# Patient Record
Sex: Male | Born: 2000 | Race: White | Hispanic: No | Marital: Single | Smoking: Current every day smoker
Health system: Southern US, Community
[De-identification: ages and names within clinical notes are randomized; demographics above are authoritative.]

## PROBLEM LIST (undated history)

## (undated) DIAGNOSIS — I10 Essential (primary) hypertension: Secondary | ICD-10-CM

## (undated) HISTORY — PX: TONSILLECTOMY: SUR1361

## (undated) HISTORY — PX: HIP SURGERY: SHX245

## (undated) HISTORY — PX: INNER EAR SURGERY: SHX679

---
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
## 2021-07-08 IMAGING — CT CT MAXILLOFACIAL WITHOUT CONTRAST
4 of 6 series · 16 of 47 positions shown, 18 images · non-contrast
Comparison: None.

CLINICAL DATA: Facial injury after hit by tree limb. Positive loss
of consciousness.

EXAM:
CT HEAD WITHOUT CONTRAST
CT MAXILLOFACIAL WITHOUT CONTRAST
TECHNIQUE: Multidetector CT imaging of the head and maxillofacial structures
were performed using the standard protocol without intravenous
contrast. Multiplanar CT image reconstructions of the maxillofacial
structures were also generated.

[Series 3: head wo · axial · 0.47mm/px · z∈[+1521,+1631]mm · 6 of 32 slices shown, 8 images]
[im 5/32  brain]
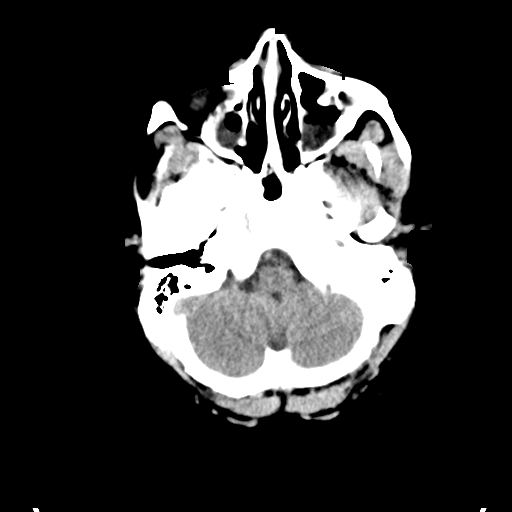
[im 5/32  bone]
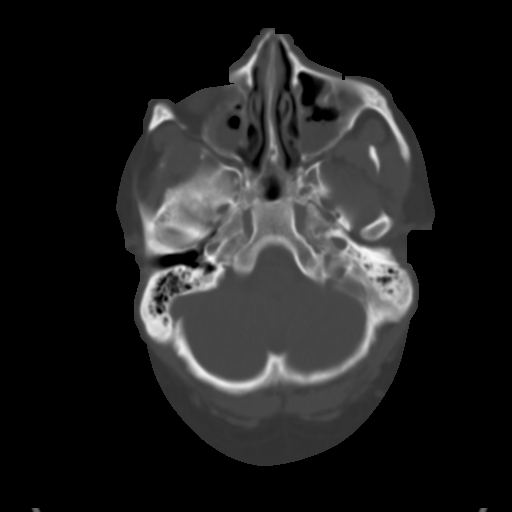
[im 9/32  bone]
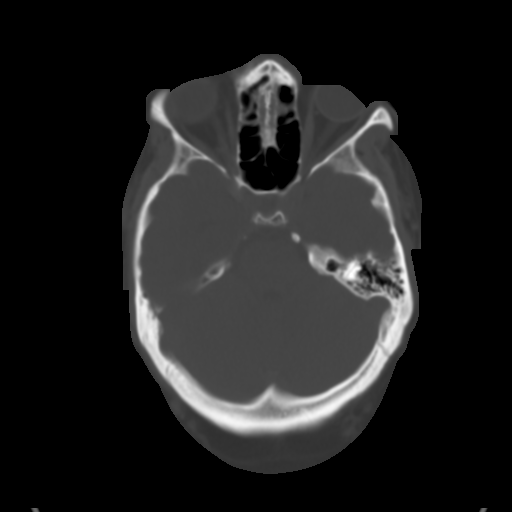
[im 14/32  bone]
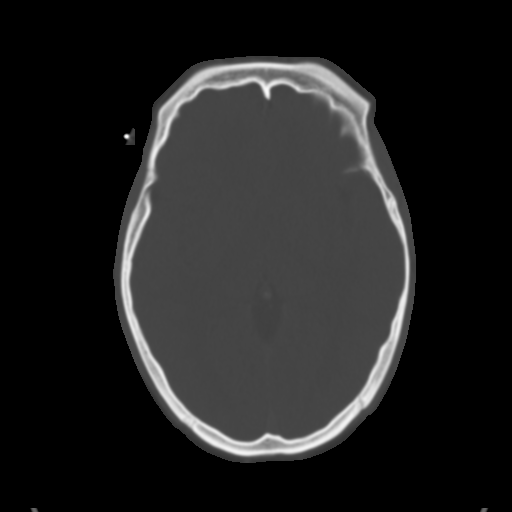
[im 18/32  bone]
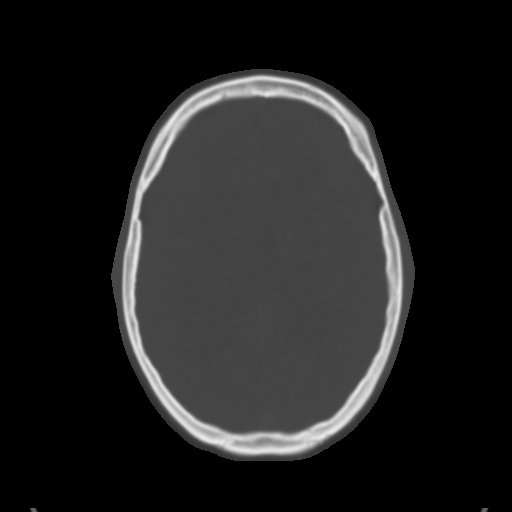
[im 23/32  brain]
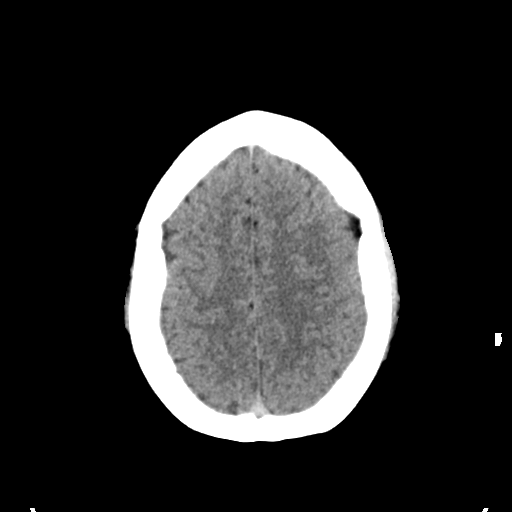
[im 23/32  bone]
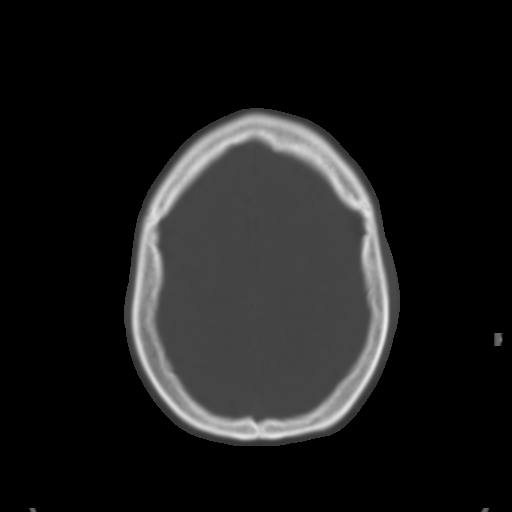
[im 27/32  bone]
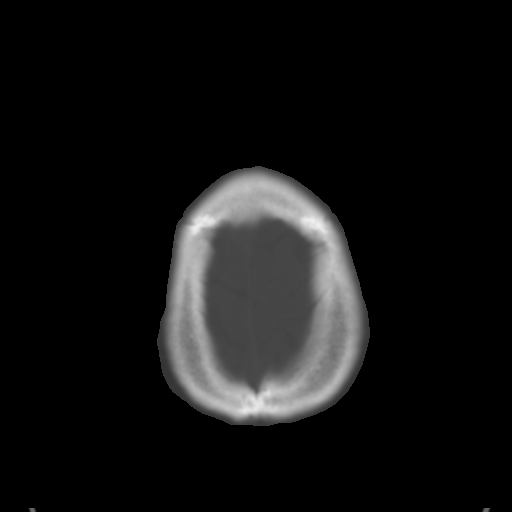

[Series 6: coronal soft tissue · coronal · 0.32mm/px · 2 of 75 slices shown]
[im 25/75  bone]
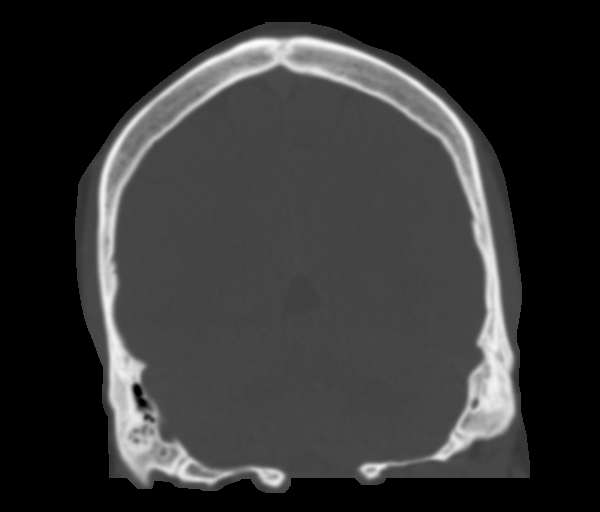
[im 50/75  bone]
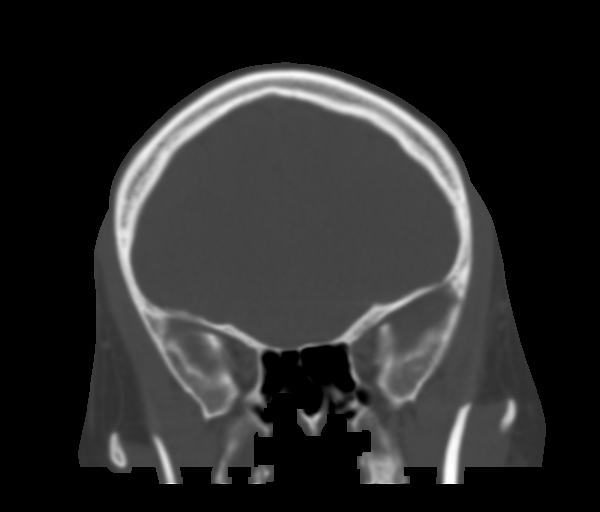

[Series 8: max soft · axial · 0.36mm/px · z∈[+1423,+1519]mm · 6 of 84 slices shown]
[im 8/84  brain]
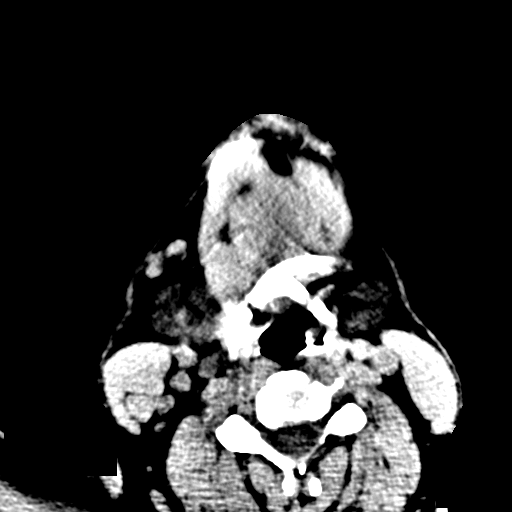
[im 16/84  brain]
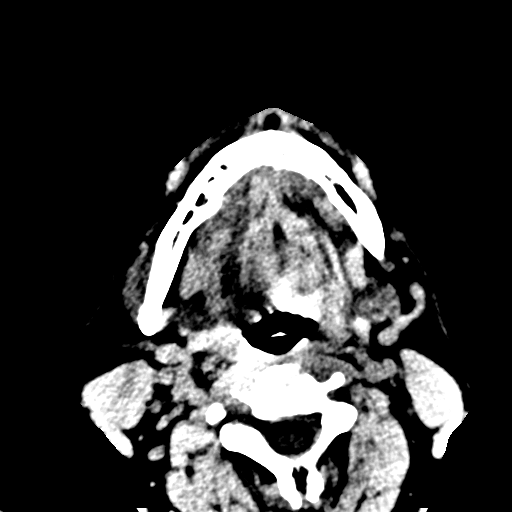
[im 28/84  brain]
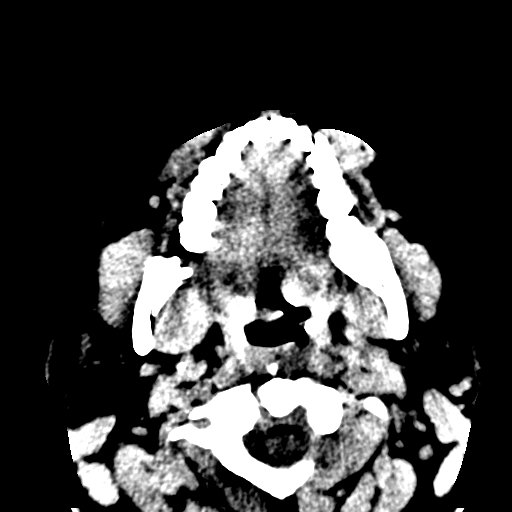
[im 36/84  brain]
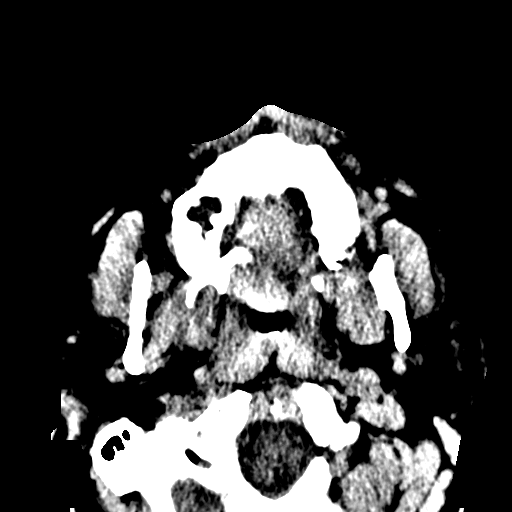
[im 48/84  brain]
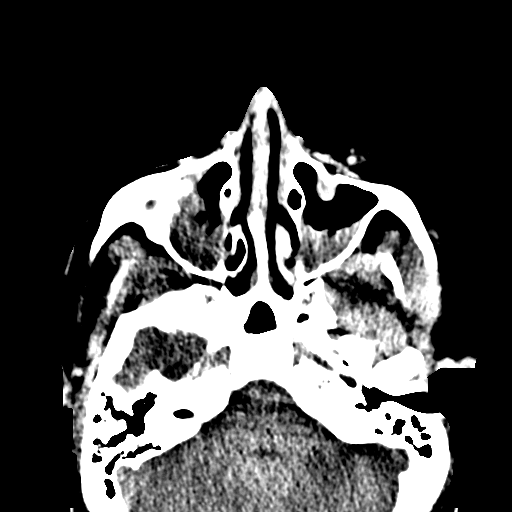
[im 56/84  brain]
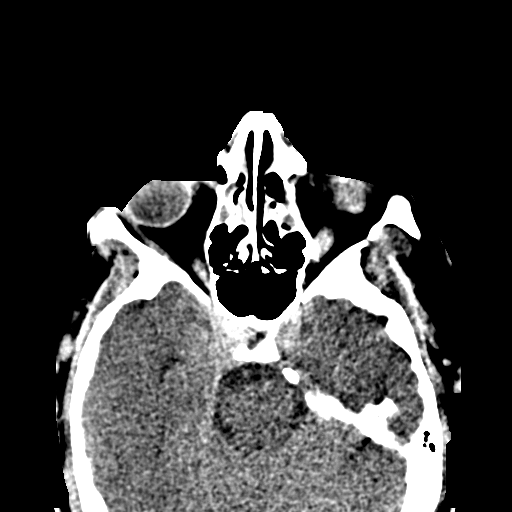

[Series 13: sagittal soft · sagittal · 0.39mm/px · 2 of 99 slices shown]
[im 33/99  bone]
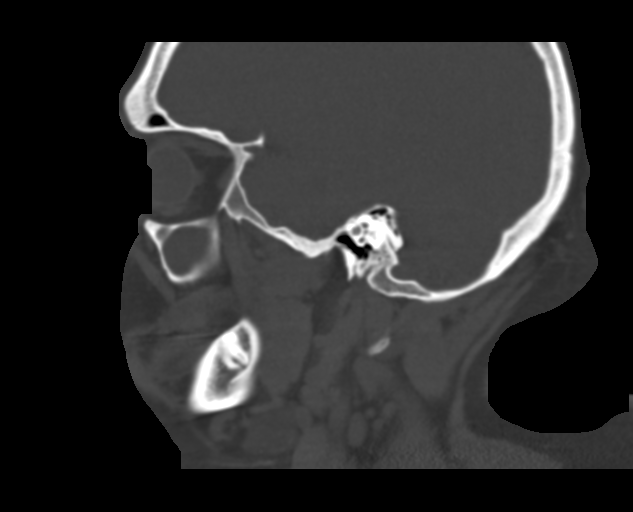
[im 66/99  bone]
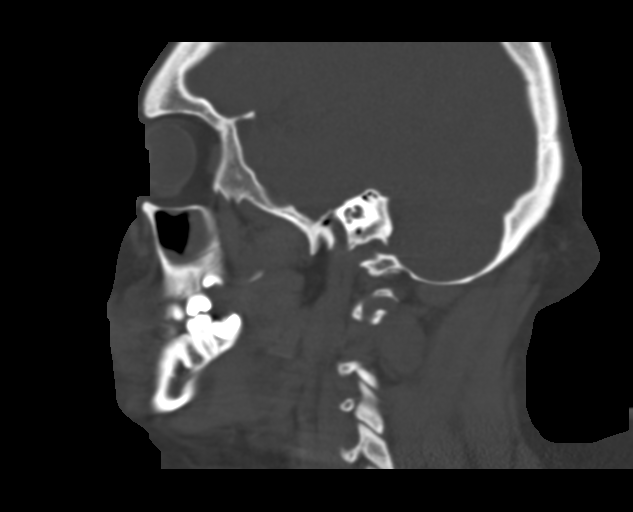

[16 of 47 positions shown; findings below may reference images not displayed]

FINDINGS: CT HEAD FINDINGS

Brain: No evidence of acute infarction, hemorrhage, hydrocephalus,
extra-axial collection or mass lesion/mass effect.

Vascular: No hyperdense vessel or unexpected calcification.

Skull: Normal. Negative for fracture or focal lesion.

Other: None.

CT MAXILLOFACIAL FINDINGS

Osseous: No fracture or mandibular dislocation. No destructive
process.

Orbits: Negative. No traumatic or inflammatory finding.

Sinuses: Bilateral ethmoid and maxillary sinusitis is noted.

Soft tissues: Negative.
IMPRESSION: Normal head CT.

Bilateral ethmoid and maxillary sinusitis. No other abnormality seen
in maxillofacial region.
